# Patient Record
Sex: Male | Born: 1970 | Race: Black or African American | Hispanic: No | Marital: Single | Smoking: Never smoker
Health system: Southern US, Community
[De-identification: ages and names within clinical notes are randomized; demographics above are authoritative.]

## PROBLEM LIST (undated history)

## (undated) DIAGNOSIS — I10 Essential (primary) hypertension: Secondary | ICD-10-CM

---

## 2004-11-09 ENCOUNTER — Emergency Department (HOSPITAL_COMMUNITY): Admission: EM | Admit: 2004-11-09 | Discharge: 2004-11-09 | Payer: Self-pay | Admitting: Emergency Medicine

## 2005-08-08 ENCOUNTER — Emergency Department (HOSPITAL_COMMUNITY): Admission: EM | Admit: 2005-08-08 | Discharge: 2005-08-08 | Payer: Self-pay | Admitting: Family Medicine

## 2006-12-27 ENCOUNTER — Emergency Department (HOSPITAL_COMMUNITY): Admission: EM | Admit: 2006-12-27 | Discharge: 2006-12-27 | Payer: Self-pay | Admitting: Emergency Medicine

## 2010-06-08 ENCOUNTER — Emergency Department (HOSPITAL_COMMUNITY)
Admission: EM | Admit: 2010-06-08 | Discharge: 2010-06-08 | Disposition: A | Discharge status: Home / Self Care | Payer: BC Managed Care – PPO | Attending: Emergency Medicine | Admitting: Emergency Medicine

## 2010-06-08 DIAGNOSIS — R197 Diarrhea, unspecified: Secondary | ICD-10-CM | POA: Insufficient documentation

## 2010-06-08 DIAGNOSIS — I10 Essential (primary) hypertension: Secondary | ICD-10-CM | POA: Insufficient documentation

## 2010-06-08 DIAGNOSIS — R112 Nausea with vomiting, unspecified: Secondary | ICD-10-CM | POA: Insufficient documentation

## 2010-06-08 LAB — POCT I-STAT, CHEM 8
BUN: 24 mg/dL — ABNORMAL HIGH (ref 6–23)
Chloride: 104 mEq/L (ref 96–112)
HCT: 54 % — ABNORMAL HIGH (ref 39.0–52.0)
Potassium: 4.1 mEq/L (ref 3.5–5.1)
Sodium: 139 mEq/L (ref 135–145)

## 2010-12-10 ENCOUNTER — Emergency Department (HOSPITAL_COMMUNITY)
Admission: EM | Admit: 2010-12-10 | Discharge: 2010-12-10 | Disposition: A | Discharge status: Home / Self Care | Payer: BC Managed Care – PPO | Attending: Emergency Medicine | Admitting: Emergency Medicine

## 2010-12-10 ENCOUNTER — Emergency Department (HOSPITAL_COMMUNITY): Payer: BC Managed Care – PPO

## 2010-12-10 DIAGNOSIS — R229 Localized swelling, mass and lump, unspecified: Secondary | ICD-10-CM | POA: Insufficient documentation

## 2010-12-10 DIAGNOSIS — I10 Essential (primary) hypertension: Secondary | ICD-10-CM | POA: Insufficient documentation

## 2010-12-10 DIAGNOSIS — M542 Cervicalgia: Secondary | ICD-10-CM | POA: Insufficient documentation

## 2011-04-29 ENCOUNTER — Emergency Department (HOSPITAL_COMMUNITY)
Admission: EM | Admit: 2011-04-29 | Discharge: 2011-04-29 | Disposition: A | Discharge status: Home / Self Care | Payer: BC Managed Care – PPO

## 2012-06-01 ENCOUNTER — Other Ambulatory Visit: Payer: Self-pay | Admitting: Occupational Medicine

## 2012-06-01 ENCOUNTER — Ambulatory Visit: Payer: Self-pay

## 2012-06-01 DIAGNOSIS — R52 Pain, unspecified: Secondary | ICD-10-CM

## 2012-10-07 ENCOUNTER — Encounter (HOSPITAL_COMMUNITY): Payer: Self-pay | Admitting: Emergency Medicine

## 2012-10-07 ENCOUNTER — Emergency Department (HOSPITAL_COMMUNITY)
Admission: EM | Admit: 2012-10-07 | Discharge: 2012-10-07 | Disposition: A | Discharge status: Home / Self Care | Payer: BC Managed Care – PPO | Source: Home / Self Care

## 2012-10-07 DIAGNOSIS — B3749 Other urogenital candidiasis: Secondary | ICD-10-CM

## 2012-10-07 DIAGNOSIS — L253 Unspecified contact dermatitis due to other chemical products: Secondary | ICD-10-CM

## 2012-10-07 DIAGNOSIS — L245 Irritant contact dermatitis due to other chemical products: Secondary | ICD-10-CM

## 2012-10-07 HISTORY — DX: Essential (primary) hypertension: I10

## 2012-10-07 MED ORDER — CLOTRIMAZOLE-BETAMETHASONE 1-0.05 % EX CREA: TOPICAL_CREAM | CUTANEOUS | Status: AC

## 2012-10-07 NOTE — ED Provider Notes (Signed)
History     CSN: [id number]  Arrival date & time 10/07/12  1605   First MD Initiated Contact with Patient 10/07/12 1717      Chief Complaint  Patient presents with  . Rash    penile rash x 2 days.     (Consider location/radiation/quality/duration/timing/severity/associated sxs/prior treatment) HPI Comments: 42 year old male is complaining of an itchy rash to the shaft of the penis. This began approximately 2 nights ago. He was during and after the time in which he was vigorously masturbating with petroleum jelly. He states the next day he had swelling along the shaft of the penis as well as an irritation and itching. Since that time some of the swelling subsided as well as the itching. He denies penile discharge, dysuria, urinary frequency, other genital pain or skin lesions.   Past Medical History  Diagnosis Date  . Hypertension     History reviewed. No pertinent past surgical history.  History reviewed. No pertinent family history.  History  Substance Use Topics  . Smoking status: Never Smoker   . Smokeless tobacco: Not on file  . Alcohol Use: No      Review of Systems  Skin:       See history of present illness  All other systems reviewed and are negative.    Allergies  Review of patient's allergies indicates no known allergies.  Home Medications   Current Outpatient Rx  Name  Route  Sig  Dispense  Refill  . clotrimazole-betamethasone (LOTRISONE) cream      Apply to affected area 2 times daily prn   15 g   0     BP 166/88  Pulse 60  Temp(Src) 98.7 F (37.1 C) (Oral)  Resp 14  SpO2 100%  Physical Exam  Nursing note and vitals reviewed. Constitutional: He is oriented to person, place, and time. He appears well-nourished. No distress.  Cardiovascular: Normal rate.   Pulmonary/Chest: Effort normal. No respiratory distress.  Genitourinary: No penile tenderness.  Normal external male genitalia. There are very small flesh-colored papules to the skin  of the shaft of the penis. Back early in small areas and along the length of the penis. It is not circumferential and only involves small areas. No penile discharge or tenderness. Both testicles descended, nontender, nonswollen no palpable nodules no tenderness in the epididymal apparatus. No regional lymphadenopathy or other abnormal findings.  Neurological: He is alert and oriented to person, place, and time. He exhibits normal muscle tone.  Skin: Skin is warm and dry.  Psychiatric: He has a normal mood and affect.    ED Course  Procedures (including critical care time)  Labs Reviewed - No data to display No results found.   1. Irritant contact dermatitis due to chemical   2. Yeast dermatitis of penis       MDM  Etiology is most likely due to a contact dermatitis from the petroleum versus irritation from masturbation with petroleum or candida of the foreskin. We will treat with Lotrisone cream twice a day. For any new symptoms problems or worsening may return. He has no symptoms of STD in the rash does not appear to be an STD.        Hayden Rasmussen, NP 10/07/12 1806

## 2012-10-07 NOTE — ED Provider Notes (Signed)
Medical screening examination/treatment/procedure(s) were performed by non-physician practitioner and as supervising physician I was immediately available for consultation/collaboration.  Demetrica Zipp   Elson Ulbrich, MD 10/07/12 1820 

## 2012-10-07 NOTE — ED Notes (Signed)
Pt c/o rash on penis x two days. Denies pelvic/abdominal pain and penile discharge. Denies concerns for stds. Pt has used alcohol and cortizone cream with no relief.

## 2014-03-21 ENCOUNTER — Other Ambulatory Visit: Payer: Self-pay | Admitting: Family Medicine

## 2017-05-20 ENCOUNTER — Encounter (HOSPITAL_COMMUNITY): Payer: Self-pay

## 2017-05-20 ENCOUNTER — Other Ambulatory Visit: Payer: Self-pay

## 2017-05-20 ENCOUNTER — Emergency Department (HOSPITAL_COMMUNITY): Payer: 59

## 2017-05-20 ENCOUNTER — Emergency Department (HOSPITAL_COMMUNITY)
Admission: EM | Admit: 2017-05-20 | Discharge: 2017-05-20 | Disposition: A | Discharge status: Home / Self Care | Payer: 59 | Attending: Emergency Medicine | Admitting: Emergency Medicine

## 2017-05-20 DIAGNOSIS — I10 Essential (primary) hypertension: Secondary | ICD-10-CM | POA: Insufficient documentation

## 2017-05-20 DIAGNOSIS — M7661 Achilles tendinitis, right leg: Secondary | ICD-10-CM | POA: Diagnosis not present

## 2017-05-20 DIAGNOSIS — M25571 Pain in right ankle and joints of right foot: Secondary | ICD-10-CM | POA: Diagnosis present

## 2017-05-20 MED ORDER — NAPROXEN 500 MG PO TABS: 500.0000 mg | ORAL_TABLET | Freq: Two times a day (BID) | ORAL | 0 refills | Status: AC

## 2017-05-20 NOTE — Discharge Instructions (Signed)
Take naproxen 2 times a day with meals.  Do not take other anti-inflammatories at the same time open (Advil, Motrin, ibuprofen, Aleve). You may supplement with Tylenol if you need further pain control. You may try topical pain relievers as well. Continue to ice, rest, and elevate your foot. Wear the brace when you are able, especially when walking and sleeping. Follow-up with the foot doctor if your pain is not improving. Return to the emergency room if you develop numbness of your foot, if your foot turns white or dark, or any new or concerning symptoms.

## 2017-05-20 NOTE — ED Provider Notes (Signed)
[facility]-EMERGENCY DEPT Provider Note   CSN: [id number] Arrival date & time: 05/20/17  7829     History   Chief Complaint Chief Complaint  Patient presents with  . Foot Pain    HPI Craig Porter is a 47 y.o. male presenting with right posterior ankle pain.  Patient states that 3 days ago, he started to notice posterior right ankle pain.  It is worse when he first wakes up in the morning.  Pain is where the Achilles tendon inserts to the calcaneus.  It is worse with dorsiflexion of the foot.  He denies numbness or tingling.  He denies fall, trauma, injury.  He reports 3 days ago, he was stepping out of his 84 wheeler when he hyperextended his right ankle, but he had no acute onset of pain at that time.  He took 1 ibuprofen without improvement of pain.  He has been icing and elevating his ankle, which provides short-term minimal relief.  He denies history of injury to the ankle before.  He does not have an orthopedist or podiatrist that he sees.  He is not on any blood thinners.  He is able to walk, although this makes the pain worse and he has to limp.  He denies pain elsewhere.  HPI  Past Medical History:  Diagnosis Date  . Hypertension     There are no active problems to display for this patient.   History reviewed. No pertinent surgical history.     Home Medications    Prior to Admission medications   Medication Sig Start Date End Date Taking? Authorizing Provider  clotrimazole-betamethasone (LOTRISONE) cream Apply to affected area 2 times daily prn 10/07/12   Hayden Rasmussen, NP  naproxen (NAPROSYN) 500 MG tablet Take 1 tablet (500 mg total) by mouth 2 (two) times daily with a meal. 05/20/17   Keoki Mchargue, PA-C    Family History History reviewed. No pertinent family history.  Social History Social History   Tobacco Use  . Smoking status: Never Smoker  . Smokeless tobacco: Never Used  Substance Use Topics  . Alcohol use: Yes    Comment:  occ  . Drug use: No     Allergies   Patient has no known allergies.   Review of Systems Review of Systems  Musculoskeletal: Positive for arthralgias.  Neurological: Negative for numbness.  Hematological: Does not bruise/bleed easily.     Physical Exam Updated Vital Signs BP (!) 132/97 (BP Location: Right Arm)   Pulse 65   Temp 98.5 F (36.9 C) (Oral)   Resp 18   Ht 5\' 6"  (1.676 m)   Wt 77.1 kg (170 lb)   SpO2 100%   BMI 27.44 kg/m   Physical Exam  Constitutional: He is oriented to person, place, and time. He appears well-developed and well-nourished. No distress.  HENT:  Head: Normocephalic and atraumatic.  Eyes: EOM are normal.  Neck: Normal range of motion.  Pulmonary/Chest: Effort normal.  Abdominal: He exhibits no distension.  Musculoskeletal: He exhibits tenderness.  Mild tenderness palpation of the right Achilles tendon insertion.  No obvious swelling.  Achilles tendon palpable.  No pain more proximally of the Achilles tendon.  Pedal pulses intact bilaterally.  Sensation intact bilaterally.  Decreased active and passive range of motion of the right ankle due to pain. No pain with inversion or eversion.  Soft compartments.  No visible hematoma, contusion, or deformity.  Neurological: He is alert and oriented to person, place, and time. No  sensory deficit.  Skin: Skin is warm. No rash noted.  Psychiatric: He has a normal mood and affect.  Nursing note and vitals reviewed.    ED Treatments / Results  Labs (all labs ordered are listed, but only abnormal results are displayed) Labs Reviewed - No data to display  EKG  EKG Interpretation None       Radiology Dg Ankle Complete Right  Result Date: 05/20/2017 CLINICAL DATA:  Pain EXAM: RIGHT ANKLE - COMPLETE 3+ VIEW COMPARISON:  None. FINDINGS: Frontal, oblique, and lateral views were obtained. No evident fracture or joint effusion. No appreciable joint space narrowing or erosion. There is a minimal exostosis  along the medial aspect of the lateral malleolus. Ankle mortise appears intact. There is calcification in the distal Achilles tendon. IMPRESSION: Calcified distal Achilles tendinosis. No evident fracture. Ankle mortise appears intact. No appreciable underlying arthropathy. Electronically Signed   By: Bretta BangWilliam  Woodruff III M.D.   On: 05/20/2017 12:12    Procedures Procedures (including critical care time)  Medications Ordered in ED Medications - No data to display   Initial Impression / Assessment and Plan / ED Course  I have reviewed the triage vital signs and the nursing notes.  Pertinent labs & imaging results that were available during my care of the patient were reviewed by me and considered in my medical decision making (see chart for details).     Pt presenting for evaluation of right ankle pain.  Physical exam reassuring, he is neurovascularly intact.  Mild tenderness palpation at the insertion of Achilles tendon.  Soft compartments.  Achilles tendon is palpable and appears intact.  X-ray shows calcified distal Achilles tendinosis.  Discussed findings with patient.  Discussed treatment with ASO brace, rest, compression, elevation, and NSAIDs.  Patient given information for podiatry for follow-up as needed.  At this time, patient appears safe for discharge.  Return precautions given.  Patient states he understands and agrees to plan.  Final Clinical Impressions(s) / ED Diagnoses   Final diagnoses:  Acute right ankle pain  Achilles tendinitis of right lower extremity    ED Discharge Orders        Ordered    naproxen (NAPROSYN) 500 MG tablet  2 times daily with meals     05/20/17 1315       Daryn Hicks, PA-C 05/20/17 1329    Long, Arlyss RepressJoshua G, MD 05/20/17 1926

## 2017-05-20 NOTE — ED Triage Notes (Signed)
Pt c/o increasing R posterior heel pain x 4 days.  Pain score 8/10.  Pt reports "I think, I stepped off a ladder wrong."  No swelling noted.  Pt has been applying ice and elevation w/o relief.

## 2017-05-20 NOTE — ED Notes (Signed)
Patient is A & O x4.  He understood AVS instructions.  Questions was answered.

## 2017-07-02 ENCOUNTER — Encounter (HOSPITAL_COMMUNITY): Payer: Self-pay

## 2017-07-02 ENCOUNTER — Emergency Department (HOSPITAL_COMMUNITY): Payer: 59

## 2017-07-02 ENCOUNTER — Other Ambulatory Visit: Payer: Self-pay

## 2017-07-02 ENCOUNTER — Emergency Department (HOSPITAL_COMMUNITY)
Admission: EM | Admit: 2017-07-02 | Discharge: 2017-07-02 | Disposition: A | Discharge status: Home / Self Care | Payer: 59 | Attending: Emergency Medicine | Admitting: Emergency Medicine

## 2017-07-02 DIAGNOSIS — R0981 Nasal congestion: Secondary | ICD-10-CM | POA: Diagnosis present

## 2017-07-02 DIAGNOSIS — Z79899 Other long term (current) drug therapy: Secondary | ICD-10-CM | POA: Diagnosis not present

## 2017-07-02 DIAGNOSIS — I10 Essential (primary) hypertension: Secondary | ICD-10-CM | POA: Insufficient documentation

## 2017-07-02 DIAGNOSIS — J189 Pneumonia, unspecified organism: Secondary | ICD-10-CM | POA: Insufficient documentation

## 2017-07-02 MED ORDER — BENZONATATE 100 MG PO CAPS: 100.0000 mg | ORAL_CAPSULE | Freq: Three times a day (TID) | ORAL | 0 refills | Status: AC | PRN

## 2017-07-02 MED ORDER — FLUTICASONE PROPIONATE 50 MCG/ACT NA SUSP: 1.0000 | Freq: Every day | NASAL | 2 refills | Status: AC

## 2017-07-02 MED ORDER — DOXYCYCLINE HYCLATE 100 MG PO CAPS: 100.0000 mg | ORAL_CAPSULE | Freq: Two times a day (BID) | ORAL | 0 refills | Status: AC

## 2017-07-02 MED ORDER — IBUPROFEN 800 MG PO TABS: 800.0000 mg | ORAL_TABLET | Freq: Three times a day (TID) | ORAL | 0 refills | Status: AC | PRN

## 2017-07-02 NOTE — ED Notes (Signed)
Pt. returned from XR. 

## 2017-07-02 NOTE — ED Notes (Signed)
ED Provider at bedside. 

## 2017-07-02 NOTE — Discharge Instructions (Signed)
You were seen in the emergency today and diagnosed with community acquired pneumonia due to suspicion findings on your chest x-ray.  I have prescribed you multiple medications today:  - Doxycycline- this is an antibiotic- take this once in the morning and once in the evening for the next 7 days. Please take all of your antibiotics until finished. You may develop abdominal discomfort or diarrhea from the antibiotic.  You may help offset this with probiotics which you can buy at the store (ask your pharmacist if unable to find) or get probiotics in the form of eating yogurt. Do not eat or take the probiotics until 2 hours after your antibiotic. If you are unable to tolerate these side effects follow-up with your primary care provider or return to the emergency department.   If you begin to experience any blistering, rashes, swelling, or difficulty breathing seek medical care for evaluation of potentially more serious side effects.   -Flonase to be used 1 spray in each nostril daily.  This medication is used to treat your congestion.  -Tessalon can be taken once every 8 hours as needed.  This medication is used to treat your cough.  -Ibuprofen to be taken once every 8 hours as needed for pain.  Please be aware that these medications may interact with other medications you are taking, please be sure to discuss your medication list with your pharmacist.   You will need to follow-up with your primary care provider in 1 week if your symptoms have not improved.  If you do not have a primary care provider one is provided in your discharge instructions.  Return to the emergency department for any new or worsening symptoms including but not limited to difficulty breathing, chest pain, passing out, or any other concerns.

## 2017-07-02 NOTE — ED Provider Notes (Signed)
[facility]   CSN: [id number] Arrival date & time: 07/02/17  [id number]     History   Chief Complaint Chief Complaint  Patient presents with  . Nasal Congestion    HPI Craig Porter is a 47 y.o. male with a hx of HTN who presents to the ED with complaint of influenza like symptoms for the past 2 days. Reports he has had subjective fever, chills, congestion, rhinorrhea, generalized body aches, non-bloody diarrhea, cough productive with sputum that is yellow/mucous. Patient states he is dyspneic and having chest pain only when coughing. Has tried OTC cough medicines and staying hydrated with juice with some relief. No other alleviating/aggravating factors. Denies nausea, vomiting, or abdominal pain. Patient did not receive flu shot. States sick contact with significant other.  HPI  Past Medical History:  Diagnosis Date  . Hypertension     There are no active problems to display for this patient.   History reviewed. No pertinent surgical history.     Home Medications    Prior to Admission medications   Medication Sig Start Date End Date Taking? Authorizing Provider  clotrimazole-betamethasone (LOTRISONE) cream Apply to affected area 2 times daily prn 10/07/12   Hayden RasmussenMabe, David, NP  naproxen (NAPROSYN) 500 MG tablet Take 1 tablet (500 mg total) by mouth 2 (two) times daily with a meal. 05/20/17   Caccavale, Sophia, PA-C    Family History History reviewed. No pertinent family history.  Social History Social History   Tobacco Use  . Smoking status: Never Smoker  . Smokeless tobacco: Never Used  Substance Use Topics  . Alcohol use: No    Frequency: Never    Comment: occ  . Drug use: No     Allergies   Patient has no known allergies.   Review of Systems Review of Systems  Constitutional: Positive for chills and fever (subjective).  HENT: Positive for congestion and rhinorrhea. Negative for ear pain and sore throat.     Respiratory: Positive for cough (productive) and shortness of breath (only with coughing).   Cardiovascular: Positive for chest pain (only with coughing).  Gastrointestinal: Positive for diarrhea. Negative for abdominal pain, blood in stool, nausea and vomiting.  Musculoskeletal: Positive for myalgias (generalized ).     Physical Exam Updated Vital Signs BP 120/71 (BP Location: Left Arm)   Pulse 95   Temp 98.7 F (37.1 C) (Oral)   Resp 18   Ht 5\' 5"  (1.651 m)   Wt 77.1 kg (170 lb)   SpO2 97%   BMI 28.29 kg/m   Physical Exam  Constitutional: He appears well-developed and well-nourished.  Non-toxic appearance. No distress.  HENT:  Head: Normocephalic and atraumatic.  Right Ear: Tympanic membrane is not perforated, not erythematous, not retracted and not bulging.  Left Ear: Tympanic membrane is not perforated, not erythematous, not retracted and not bulging.  Nose: Mucosal edema (and congestion) present. Right sinus exhibits no maxillary sinus tenderness and no frontal sinus tenderness. Left sinus exhibits no maxillary sinus tenderness and no frontal sinus tenderness.  Mouth/Throat: Uvula is midline and oropharynx is clear and moist. No oropharyngeal exudate or posterior oropharyngeal erythema.  Eyes: Conjunctivae are normal. Right eye exhibits no discharge. Left eye exhibits no discharge.  Neck: Normal range of motion. Neck supple.  Cardiovascular: Normal rate and regular rhythm.  No murmur heard. Pulmonary/Chest: Effort normal. No tachypnea. No respiratory distress. He has no decreased breath sounds. He has no wheezes. He has rhonchi (L  sided). He has no rales.  Abdominal: Soft. He exhibits no distension. There is no tenderness.  Lymphadenopathy:    He has no cervical adenopathy.  Neurological: He is alert.  Clear speech.   Skin: Skin is warm and dry. No rash noted.  Psychiatric: He has a normal mood and affect. His behavior is normal.  Nursing note and vitals  reviewed.   ED Treatments / Results  Labs (all labs ordered are listed, but only abnormal results are displayed) Labs Reviewed - No data to display  EKG  EKG Interpretation None       Radiology Dg Chest 2 View  Result Date: 07/02/2017 CLINICAL DATA:  Shortness of breath, cough, fever/chills EXAM: CHEST  2 VIEW COMPARISON:  None. FINDINGS: Mild left suprahilar opacity may reflect prominent pulmonary markings, although very mild pneumonia is possible. Right lung is essentially clear. No pleural effusion or pneumothorax. The heart is normal in size. Visualized osseous structures are within normal limits. IMPRESSION: Mild left suprahilar opacity, mild pneumonia not excluded. Electronically Signed   By: Charline Bills M.D.   On: 07/02/2017 10:06    Procedures Procedures (including critical care time)  Medications Ordered in ED Medications - No data to display   Initial Impression / Assessment and Plan / ED Course  I have reviewed the triage vital signs and the nursing notes.  Pertinent labs & imaging results that were available during my care of the patient were reviewed by me and considered in my medical decision making (see chart for details).   Patient presents with influenza like sxs. He is nontoxic appearing, in no apparent distress, vitals WNL. Patient is afebrile, no sinus tenderness- doubt acute bacterial sinusitis. Centor Score 0- doubt strep pharyngitis.  CXR findings with left opacity- mild pneumonia not excluded. Will cover for pneumonia with doxycycline as well as supportive care with Flonase, Tessalon, and Ibuprofen. I discussed results, treatment plan, need for PCP follow-up, and return precautions with the patient. Provided opportunity for questions, patient confirmed understanding and is in agreement with plan.   Final Clinical Impressions(s) / ED Diagnoses   Final diagnoses:  Community acquired pneumonia of left lung, unspecified part of lung    ED Discharge  Orders        Ordered    doxycycline (VIBRAMYCIN) 100 MG capsule  2 times daily     07/02/17 1016    fluticasone (FLONASE) 50 MCG/ACT nasal spray  Daily     07/02/17 1016    benzonatate (TESSALON) 100 MG capsule  3 times daily PRN     07/02/17 1016    ibuprofen (ADVIL,MOTRIN) 800 MG tablet  Every 8 hours PRN     07/02/17 1016       Akaisha Truman, [place] R, PA-C 07/02/17 1039    Raeford Razor, MD 07/02/17 1528

## 2017-07-02 NOTE — ED Triage Notes (Signed)
Pt states that he started having fever and chills on Friday, no recorded temp at home. C/O SOB, productive cough. Denies NV.

## 2018-08-27 IMAGING — CR DG ANKLE COMPLETE 3+V*R*
3 series · 3 of 3 positions shown · non-contrast
Comparison: None.

CLINICAL DATA: Pain

EXAM:
RIGHT ANKLE - COMPLETE 3+ VIEW

[x ankle ap right]
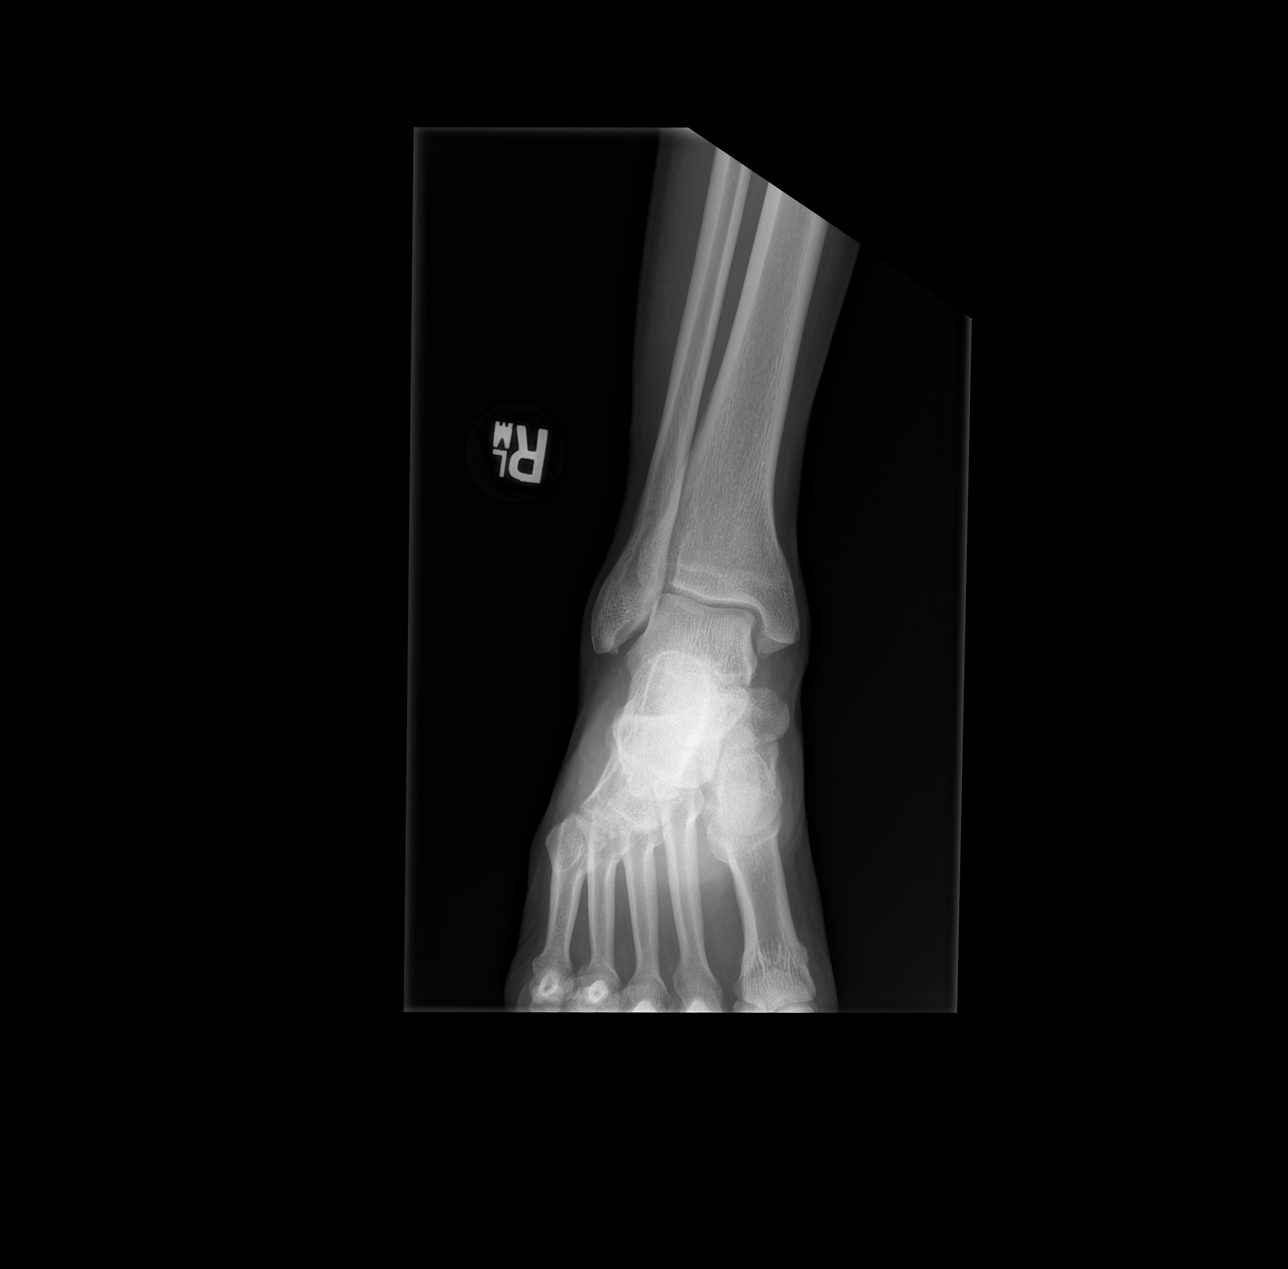

[x ankle obl right]
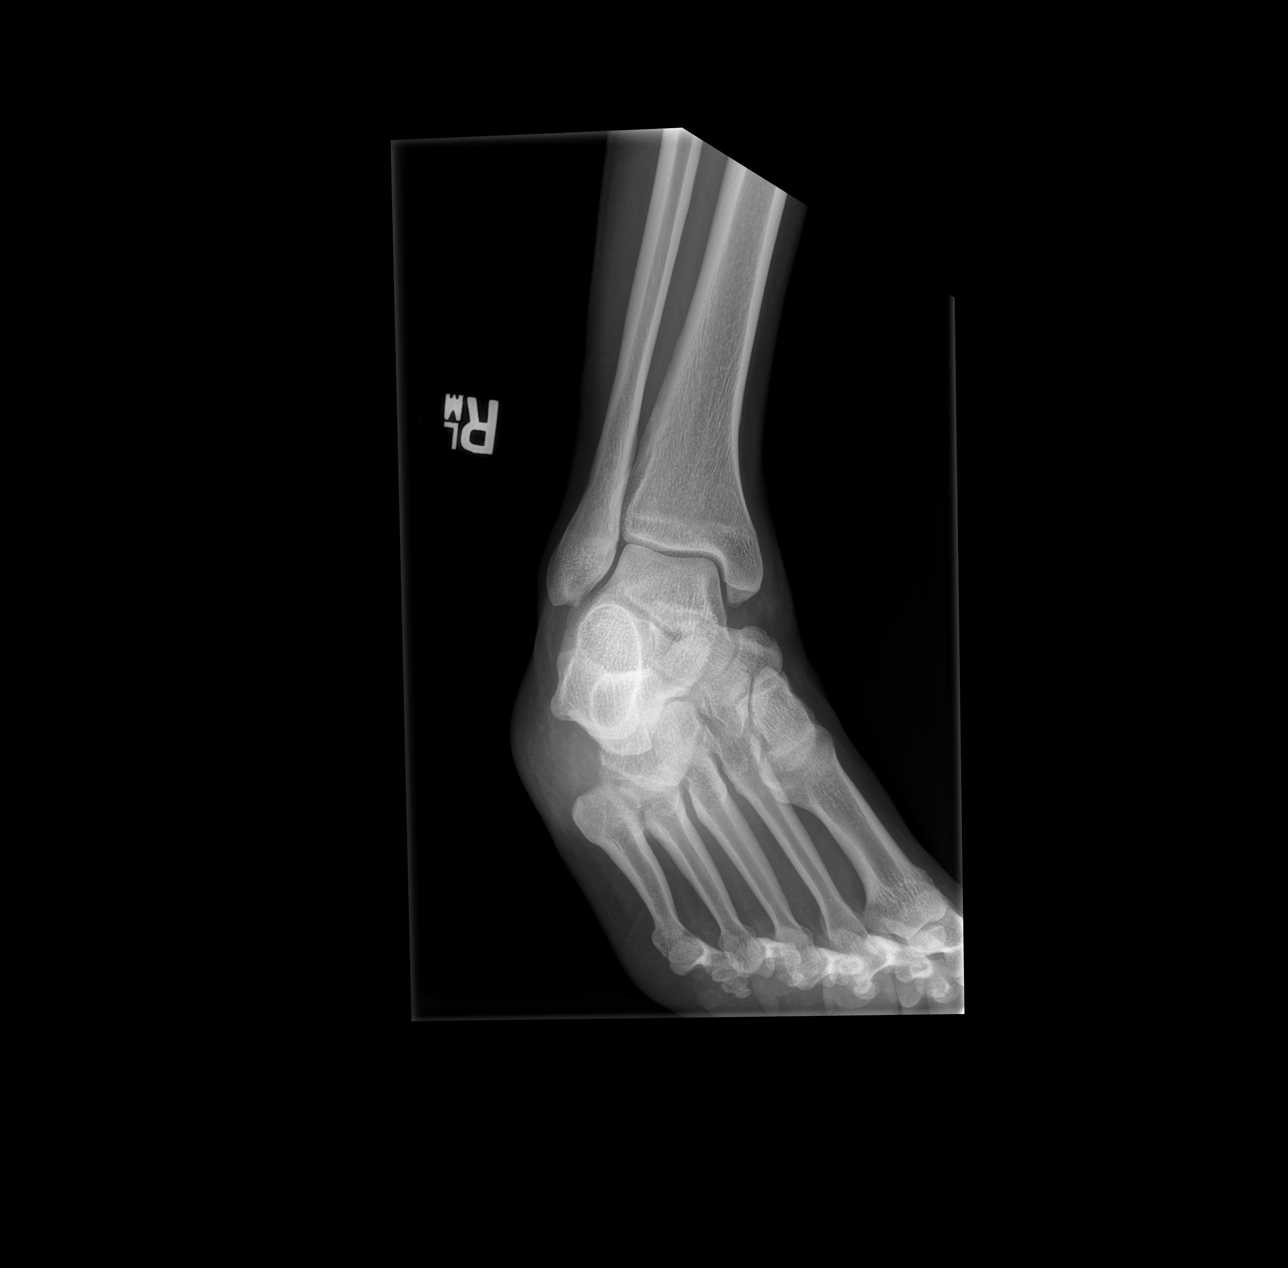

[x ankle lat right]
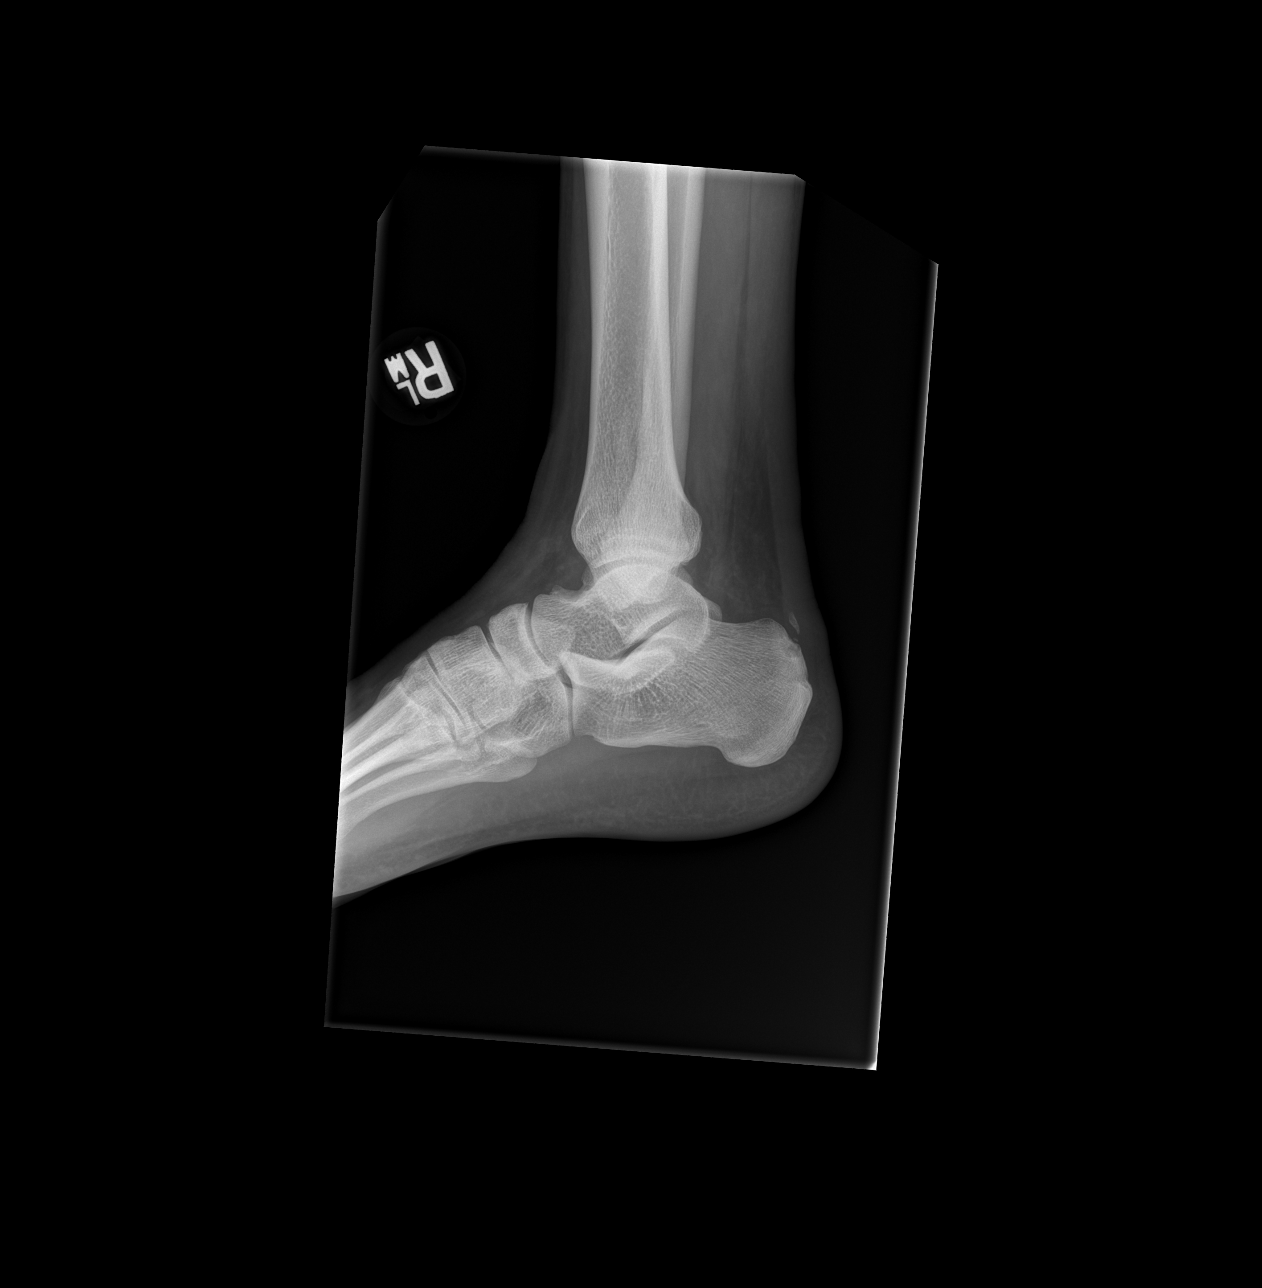

[3 of 3 positions shown; findings below may reference images not displayed]

FINDINGS: Frontal, oblique, and lateral views were obtained. No evident
fracture or joint effusion. No appreciable joint space narrowing or
erosion. There is a minimal exostosis along the medial aspect of the
lateral malleolus. Ankle mortise appears intact. There is
calcification in the distal Achilles tendon.
IMPRESSION: Calcified distal Achilles tendinosis. No evident fracture. Ankle
mortise appears intact. No appreciable underlying arthropathy.

## 2022-02-07 ENCOUNTER — Other Ambulatory Visit (HOSPITAL_COMMUNITY): Payer: Self-pay

## 2022-02-07 MED ORDER — ALBUTEROL SULFATE HFA 108 (90 BASE) MCG/ACT IN AERS
2.0000 | INHALATION_SPRAY | Freq: Four times a day (QID) | RESPIRATORY_TRACT | 1 refills | Status: AC | PRN
  Filled 2022-02-07: qty 6.7, 17d supply, fill #0

## 2022-02-07 MED ORDER — LISINOPRIL 40 MG PO TABS
40.0000 mg | ORAL_TABLET | Freq: Every day | ORAL | 1 refills | Status: DC
  Filled 2022-02-07: qty 90, 90d supply, fill #0
  Filled 2022-06-09: qty 90, 90d supply, fill #1

## 2022-02-07 MED ORDER — AMLODIPINE BESYLATE 10 MG PO TABS
10.0000 mg | ORAL_TABLET | Freq: Every day | ORAL | 1 refills | Status: DC
  Filled 2022-02-07: qty 90, 90d supply, fill #0
  Filled 2022-07-06: qty 90, 90d supply, fill #1

## 2022-05-04 ENCOUNTER — Other Ambulatory Visit (HOSPITAL_COMMUNITY): Payer: Self-pay

## 2022-05-04 MED ORDER — HYDROCORTISONE (PERIANAL) 2.5 % EX CREA
1.0000 | TOPICAL_CREAM | Freq: Two times a day (BID) | CUTANEOUS | 3 refills | Status: AC
  Filled 2022-05-04: qty 30, 10d supply, fill #0

## 2022-05-18 ENCOUNTER — Other Ambulatory Visit (HOSPITAL_COMMUNITY): Payer: Self-pay

## 2022-09-03 ENCOUNTER — Other Ambulatory Visit (HOSPITAL_COMMUNITY): Payer: Self-pay

## 2022-09-05 ENCOUNTER — Other Ambulatory Visit (HOSPITAL_COMMUNITY): Payer: Self-pay

## 2022-09-05 MED ORDER — LISINOPRIL 40 MG PO TABS
40.0000 mg | ORAL_TABLET | Freq: Every day | ORAL | 0 refills | Status: DC
  Filled 2022-09-05 – 2022-09-19 (×2): qty 90, 90d supply, fill #0

## 2022-09-16 ENCOUNTER — Other Ambulatory Visit (HOSPITAL_COMMUNITY): Payer: Self-pay

## 2022-09-19 ENCOUNTER — Other Ambulatory Visit (HOSPITAL_COMMUNITY): Payer: Self-pay

## 2022-10-07 ENCOUNTER — Other Ambulatory Visit (HOSPITAL_COMMUNITY): Payer: Self-pay

## 2022-10-07 MED ORDER — AMLODIPINE BESYLATE 10 MG PO TABS
10.0000 mg | ORAL_TABLET | Freq: Every day | ORAL | 1 refills | Status: DC
  Filled 2022-10-07: qty 90, 90d supply, fill #0
  Filled 2023-01-10: qty 90, 90d supply, fill #1

## 2022-10-11 ENCOUNTER — Other Ambulatory Visit (HOSPITAL_COMMUNITY): Payer: Self-pay

## 2022-12-26 ENCOUNTER — Other Ambulatory Visit (HOSPITAL_COMMUNITY): Payer: Self-pay

## 2022-12-28 ENCOUNTER — Other Ambulatory Visit (HOSPITAL_COMMUNITY): Payer: Self-pay

## 2022-12-30 ENCOUNTER — Other Ambulatory Visit (HOSPITAL_COMMUNITY): Payer: Self-pay

## 2022-12-30 MED ORDER — LISINOPRIL 40 MG PO TABS
40.0000 mg | ORAL_TABLET | Freq: Every day | ORAL | 0 refills | Status: DC
  Filled 2022-12-30: qty 90, 90d supply, fill #0

## 2023-01-10 ENCOUNTER — Other Ambulatory Visit (HOSPITAL_COMMUNITY): Payer: Self-pay

## 2023-01-11 ENCOUNTER — Other Ambulatory Visit (HOSPITAL_COMMUNITY): Payer: Self-pay

## 2023-04-07 ENCOUNTER — Other Ambulatory Visit (HOSPITAL_COMMUNITY): Payer: Self-pay

## 2023-04-07 MED ORDER — ALBUTEROL SULFATE HFA 108 (90 BASE) MCG/ACT IN AERS
2.0000 | INHALATION_SPRAY | RESPIRATORY_TRACT | 1 refills | Status: AC | PRN
  Filled 2023-04-07: qty 6.7, 25d supply, fill #0

## 2023-04-07 MED ORDER — TRIAMCINOLONE ACETONIDE 0.1 % EX CREA
1.0000 | TOPICAL_CREAM | Freq: Two times a day (BID) | CUTANEOUS | 0 refills | Status: AC
  Filled 2023-04-07: qty 45, 23d supply, fill #0

## 2023-04-07 MED ORDER — AMLODIPINE BESYLATE 10 MG PO TABS
10.0000 mg | ORAL_TABLET | Freq: Every day | ORAL | 3 refills | Status: AC
  Filled 2023-04-07: qty 90, 90d supply, fill #0
  Filled 2023-07-28: qty 90, 90d supply, fill #1
  Filled 2023-11-23: qty 90, 90d supply, fill #2

## 2023-04-07 MED ORDER — LISINOPRIL 40 MG PO TABS
40.0000 mg | ORAL_TABLET | Freq: Every day | ORAL | 3 refills | Status: AC
  Filled 2023-04-07: qty 90, 90d supply, fill #0
  Filled 2023-11-23: qty 90, 90d supply, fill #1

## 2023-04-10 ENCOUNTER — Other Ambulatory Visit (HOSPITAL_COMMUNITY): Payer: Self-pay

## 2023-04-11 ENCOUNTER — Other Ambulatory Visit (HOSPITAL_COMMUNITY): Payer: Self-pay

## 2023-04-11 MED ORDER — ROSUVASTATIN CALCIUM 10 MG PO TABS
10.0000 mg | ORAL_TABLET | Freq: Every day | ORAL | 3 refills | Status: AC
  Filled 2023-04-11: qty 90, 90d supply, fill #0
  Filled 2023-08-17: qty 90, 90d supply, fill #1
  Filled 2023-11-23: qty 90, 90d supply, fill #2

## 2023-07-28 ENCOUNTER — Other Ambulatory Visit (HOSPITAL_COMMUNITY): Payer: Self-pay

## 2023-07-28 MED ORDER — LISINOPRIL 40 MG PO TABS
40.0000 mg | ORAL_TABLET | Freq: Every day | ORAL | 0 refills | Status: AC
  Filled 2023-07-28: qty 90, 90d supply, fill #0

## 2023-08-17 ENCOUNTER — Other Ambulatory Visit (HOSPITAL_COMMUNITY): Payer: Self-pay

## 2023-11-23 ENCOUNTER — Other Ambulatory Visit (HOSPITAL_COMMUNITY): Payer: Self-pay

## 2023-11-24 ENCOUNTER — Other Ambulatory Visit (HOSPITAL_COMMUNITY): Payer: Self-pay

## 2023-12-15 ENCOUNTER — Encounter (HOSPITAL_COMMUNITY): Payer: Self-pay

## 2023-12-15 ENCOUNTER — Other Ambulatory Visit: Payer: Self-pay

## 2023-12-15 ENCOUNTER — Emergency Department (HOSPITAL_COMMUNITY)
Admission: EM | Admit: 2023-12-15 | Discharge: 2023-12-16 | Disposition: A | Discharge status: Home / Self Care | Attending: Emergency Medicine | Admitting: Emergency Medicine

## 2023-12-15 DIAGNOSIS — Z79899 Other long term (current) drug therapy: Secondary | ICD-10-CM | POA: Diagnosis not present

## 2023-12-15 DIAGNOSIS — R7989 Other specified abnormal findings of blood chemistry: Secondary | ICD-10-CM | POA: Insufficient documentation

## 2023-12-15 DIAGNOSIS — I10 Essential (primary) hypertension: Secondary | ICD-10-CM | POA: Insufficient documentation

## 2023-12-15 DIAGNOSIS — T7840XA Allergy, unspecified, initial encounter: Secondary | ICD-10-CM | POA: Diagnosis present

## 2023-12-15 DIAGNOSIS — L500 Allergic urticaria: Secondary | ICD-10-CM | POA: Insufficient documentation

## 2023-12-15 LAB — COMPREHENSIVE METABOLIC PANEL WITH GFR
ALT: 23 U/L (ref 0–44)
AST: 24 U/L (ref 15–41)
Albumin: 4.2 g/dL (ref 3.5–5.0)
Alkaline Phosphatase: 72 U/L (ref 38–126)
Anion gap: 10 (ref 5–15)
BUN: 14 mg/dL (ref 6–20)
CO2: 22 mmol/L (ref 22–32)
Calcium: 9.2 mg/dL (ref 8.9–10.3)
Chloride: 106 mmol/L (ref 98–111)
Creatinine, Ser: 1.37 mg/dL — ABNORMAL HIGH (ref 0.61–1.24)
GFR, Estimated: >60 mL/min (ref >60)
Glucose, Bld: 107 mg/dL — ABNORMAL HIGH (ref 70–99)
Potassium: 3.9 mmol/L (ref 3.5–5.1)
Sodium: 138 mmol/L (ref 135–145)
Total Bilirubin: 1.4 mg/dL — ABNORMAL HIGH (ref 0.0–1.2)
Total Protein: 7.8 g/dL (ref 6.5–8.1)

## 2023-12-15 LAB — CBC WITH DIFFERENTIAL/PLATELET
Abs Immature Granulocytes: 0.02 K/uL (ref 0.00–0.07)
Basophils Absolute: 0.1 K/uL (ref 0.0–0.1)
Basophils Relative: 1 %
Eosinophils Absolute: 0.1 K/uL (ref 0.0–0.5)
Eosinophils Relative: 1 %
HCT: 50 % (ref 39.0–52.0)
Hemoglobin: 16.6 g/dL (ref 13.0–17.0)
Immature Granulocytes: 0 %
Lymphocytes Relative: 30 %
Lymphs Abs: 2.6 K/uL (ref 0.7–4.0)
MCH: 28.4 pg (ref 26.0–34.0)
MCHC: 33.2 g/dL (ref 30.0–36.0)
MCV: 85.5 fL (ref 80.0–100.0)
Monocytes Absolute: 0.6 K/uL (ref 0.1–1.0)
Monocytes Relative: 7 %
Neutro Abs: 5.2 K/uL (ref 1.7–7.7)
Neutrophils Relative %: 61 %
Platelets: 366 K/uL (ref 150–400)
RBC: 5.85 MIL/uL — ABNORMAL HIGH (ref 4.22–5.81)
RDW: 12.6 % (ref 11.5–15.5)
WBC: 8.4 K/uL (ref 4.0–10.5)
nRBC: 0 % (ref 0.0–0.2)

## 2023-12-15 MED ORDER — FAMOTIDINE IN NACL 20-0.9 MG/50ML-% IV SOLN
20.0000 mg | Freq: Once | INTRAVENOUS | Status: AC
  Administered 2023-12-15: 20 mg via INTRAVENOUS
  Filled 2023-12-15: qty 50

## 2023-12-15 MED ORDER — METHYLPREDNISOLONE SODIUM SUCC 125 MG IJ SOLR
125.0000 mg | Freq: Once | INTRAMUSCULAR | Status: AC
  Administered 2023-12-15: 125 mg via INTRAVENOUS
  Filled 2023-12-15: qty 2

## 2023-12-15 MED ORDER — DIPHENHYDRAMINE HCL 50 MG/ML IJ SOLN
25.0000 mg | Freq: Once | INTRAMUSCULAR | Status: AC
  Administered 2023-12-15: 25 mg via INTRAVENOUS
  Filled 2023-12-15: qty 1

## 2023-12-15 NOTE — ED Provider Triage Note (Signed)
 Emergency Medicine Provider Triage Evaluation Note  Craig Porter , a 53 y.o. male  was evaluated in triage.  Pt complains of allergic reaction.  Patient reports that he was stung by a wasp on the left hand around 8 PM.  No reported history of any allergies to sports he is aware.  Does endorse some swelling and hives of developed over his body.  Denies any nausea, vomiting, difficulty breathing.  Denies any difficulty swallowing.  Review of Systems  Positive: As above Negative: As above  Physical Exam  BP 134/79 (BP Location: Right Arm)   Pulse 88   Temp 98.4 F (36.9 C) (Oral)   Resp 17   SpO2 99%  Gen:   Awake, no distress   Resp:  Normal effort  MSK:   Moves extremities without difficulty  Other:  Is present primarily to the left side of his body in the abdomen.  No oropharyngeal swelling or edema, wheezing, or hypotension.  Medical Decision Making  Medically screening exam initiated at 10:08 PM.  Appropriate orders placed.  Craig Porter was informed that the remainder of the evaluation will be completed by another provider, this initial triage assessment does not replace that evaluation, and the importance of remaining in the ED until their evaluation is complete.  Patient not currently anaphylactic.  Allergy orders placed.  Will transfer to room quickly as patient has become diaphoretic.   Craig Porter A, PA-C 12/15/23 2210

## 2023-12-15 NOTE — ED Triage Notes (Signed)
 The patient reports he was stung by a wasp on his left hand tonight around 8pm. He presents with swelling to left hand/arm with hives all over his body and swelling to his chin. Denies SOB/trouble breathing or swallowing. He is speaking clear complete sentences.

## 2023-12-15 NOTE — ED Provider Notes (Signed)
 [place] [facility] AT [facility] Provider Note   CSN: [id number] Arrival date & time: 12/15/23  2150     Patient presents with: Allergic Reaction   Craig Porter is a 53 y.o. male with history of hypertension.  Presents to ED for evaluation of allergic reaction.  Reports that about 1.5 hours ago was at home working on old cars.  States that he lifted the hood of one of his old trucks and there was a wasps nest.  Reports that wasp stung him in his left hand.  Reports about 15 minutes later began having hives throughout, itching, left hand swelling.  Denies history of anaphylaxis.  Denies history of allergic reaction to wasps.  Currently, denies any shortness of breath, trouble swallowing or drooling.  Endorsing urticarial hives throughout upper extremities, trunk that he reports are itching.  No medications prior to arrival.  Denies nausea, vomiting, abdominal pain, diarrhea.    Allergic Reaction Presenting symptoms: rash        Prior to Admission medications   Medication Sig Start Date End Date Taking? Authorizing Provider  EPINEPHrine  0.3 mg/0.3 mL IJ SOAJ injection Inject 0.3 mg into the muscle as needed for anaphylaxis. 12/16/23  Yes Ruthell Lonni FALCON, PA-C  albuterol  (VENTOLIN  HFA) 108 (90 Base) MCG/ACT inhaler Inhale 2 puffs into the lungs every 4 to 6 hours as needed for wheezing or shortness of breath 02/07/22     albuterol  (VENTOLIN  HFA) 108 (90 Base) MCG/ACT inhaler Inhale 2 puffs into the lungs every 4-6 hours as needed for wheezing or shortness of breath 04/07/23     amLODipine  (NORVASC ) 10 MG tablet Take 1 tablet (10 mg total) by mouth daily. 04/07/23     benzonatate  (TESSALON ) 100 MG capsule Take 1 capsule (100 mg total) by mouth 3 (three) times daily as needed for cough. 07/02/17   Petrucelli, Samantha R, PA-C  clotrimazole -betamethasone  (LOTRISONE ) cream Apply to affected area 2 times daily prn 10/07/12   Tharon Lenis, NP  doxycycline  (VIBRAMYCIN ) 100 MG  capsule Take 1 capsule (100 mg total) by mouth 2 (two) times daily. 07/02/17   Petrucelli, Samantha R, PA-C  fluticasone  (FLONASE ) 50 MCG/ACT nasal spray Place 1 spray into both nostrils daily. 07/02/17   Petrucelli, Samantha R, PA-C  hydrocortisone  (ANUSOL -HC) 2.5 % rectal cream Place 1 Application rectally 2 (two) times daily for 7 days 05/04/22     ibuprofen  (ADVIL ,MOTRIN ) 800 MG tablet Take 1 tablet (800 mg total) by mouth every 8 (eight) hours as needed. 07/02/17   Petrucelli, Samantha R, PA-C  lisinopril  (ZESTRIL ) 40 MG tablet Take 1 tablet (40 mg total) by mouth daily. 04/07/23     lisinopril  (ZESTRIL ) 40 MG tablet Take 1 tablet (40 mg total) by mouth daily. PLEASE KEEP PHYSICAL EXAM APPOINTMENT FOR FURTHER REFILLS 07/28/23     naproxen  (NAPROSYN ) 500 MG tablet Take 1 tablet (500 mg total) by mouth 2 (two) times daily with a meal. 05/20/17   Caccavale, Sophia, PA-C  rosuvastatin  (CRESTOR ) 10 MG tablet Take 1 tablet (10 mg total) by mouth at bedtime. 04/11/23     triamcinolone  cream (KENALOG ) 0.1 % Apply 1 Application topically 2 (two) times daily. 04/07/23       Allergies: Patient has no known allergies.    Review of Systems  Skin:  Positive for rash.  All other systems reviewed and are negative.   Updated Vital Signs BP 134/79 (BP Location: Right Arm)   Pulse 88   Temp 98.4 F (36.9  C) (Oral)   Resp 17   Ht 5' 5 (1.651 m)   Wt 77.1 kg   SpO2 99%   BMI 28.29 kg/m   Physical Exam Vitals and nursing note reviewed.  Constitutional:      General: He is not in acute distress.    Appearance: He is well-developed.  HENT:     Head: Normocephalic and atraumatic.     Mouth/Throat:     Comments: Uvula midline, handling secretions appropriately, no drooling, change mentation. Eyes:     Conjunctiva/sclera: Conjunctivae normal.  Cardiovascular:     Rate and Rhythm: Normal rate and regular rhythm.     Heart sounds: No murmur heard. Pulmonary:     Effort: Pulmonary effort is normal. No  respiratory distress.     Breath sounds: Normal breath sounds.  Abdominal:     Palpations: Abdomen is soft.     Tenderness: There is no abdominal tenderness.  Musculoskeletal:        General: No swelling.     Cervical back: Neck supple.     Comments: Swelling to left hand  Skin:    General: Skin is warm and dry.     Capillary Refill: Capillary refill takes less than 2 seconds.     Findings: Rash present.     Comments: Urticarial hives to bilateral upper extremities.  Neurological:     Mental Status: He is alert and oriented to person, place, and time. Mental status is at baseline.  Psychiatric:        Mood and Affect: Mood normal.     (all labs ordered are listed, but only abnormal results are displayed) Labs Reviewed  CBC WITH DIFFERENTIAL/PLATELET - Abnormal; Notable for the following components:      Result Value   RBC 5.85 (*)    All other components within normal limits  COMPREHENSIVE METABOLIC PANEL WITH GFR - Abnormal; Notable for the following components:   Glucose, Bld 107 (*)    Creatinine, Ser 1.37 (*)    Total Bilirubin 1.4 (*)    All other components within normal limits    EKG: None  Radiology: No results found.  Procedures   Medications Ordered in the ED  methylPREDNISolone  sodium succinate (SOLU-MEDROL ) 125 mg/2 mL injection 125 mg (125 mg Intravenous Given 12/15/23 2211)  famotidine  (PEPCID ) IVPB 20 mg premix (0 mg Intravenous Stopped 12/15/23 2333)  diphenhydrAMINE  (BENADRYL ) injection 25 mg (25 mg Intravenous Given 12/15/23 2211)    Clinical Course as of 12/16/23 0116  Fri Dec 15, 2023  2338 Reassessed, symptoms are solved, will observe for 3 hours [CG]    Clinical Course User Index [CG] Ruthell Lonni FALCON, PA-C   Medical Decision Making Risk Prescription drug management.   This is a 53 year old male who presents to the ED for evaluation of allergic reaction.  Patient on examination does have hives to bilateral upper extremities.  His  uvula is midline, he is handling secretions, he is not drooling, there is no change phonation.  His left hand is swollen, 2+ radial pulse.  Brisk cap refill.  Full ROM of left wrist.  Patient seen in triage.  In triage, the labs are collected to include CBC, CMP.  Patient was given 25 mg of Benadryl , 20 mg famotidine , 125 Solu-Medrol .  Patient laboratory work grossly unremarkable.  Creatinine is elevated 1.37 however this is baseline for patient through Care Everywhere.  After 3 hours, patient reports his symptoms of all resolved.  He has had no return of  symptoms.  At this time, patient be discharged home.  Advised to take ibuprofen  for pain.  Sent with EpiPen .  Advised to follow-up with PCP.  Given return precautions and he voiced understanding.  Stable to discharge.    Final diagnoses:  Allergic reaction, initial encounter    ED Discharge Orders          Ordered    EPINEPHrine  0.3 mg/0.3 mL IJ SOAJ injection  As needed,   Status:  Discontinued        12/16/23 0113    EPINEPHrine  0.3 mg/0.3 mL IJ SOAJ injection  As needed        12/16/23 0113               Ruthell Lonni FALCON, PA-C 12/16/23 9883    Dean Clarity, MD 12/16/23 2105

## 2023-12-16 MED ORDER — EPINEPHRINE 0.3 MG/0.3ML IJ SOAJ: 0.3000 mg | INTRAMUSCULAR | 0 refills | Status: AC | PRN

## 2023-12-16 MED ORDER — EPINEPHRINE 0.3 MG/0.3ML IJ SOAJ: 0.3000 mg | INTRAMUSCULAR | 0 refills | Status: DC | PRN

## 2023-12-16 NOTE — Discharge Instructions (Signed)
 It was a pleasure taking part in your care.  As we discussed, I believe that you had an allergic reaction to the bee sting.  Please take ibuprofen  for pain and left hand.  May take Benadryl  25 mg every 6 hours for any itching.  Please pick up EpiPen  and administer this to yourself if you are stung and began to feel as if you cannot swallow.  Follow-up with your PCP.  Return to the ED with any new symptoms.

## 2024-02-28 ENCOUNTER — Other Ambulatory Visit (HOSPITAL_COMMUNITY): Payer: Self-pay
